# Patient Record
Sex: Male | Born: 2000 | Race: Black or African American | Hispanic: No | Marital: Single | Smoking: Never smoker
Health system: Southern US, Community
[De-identification: ages and names within clinical notes are randomized; demographics above are authoritative.]

## PROBLEM LIST (undated history)

## (undated) DIAGNOSIS — J302 Other seasonal allergic rhinitis: Secondary | ICD-10-CM

---
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
## 2021-08-05 ENCOUNTER — Ambulatory Visit (HOSPITAL_COMMUNITY)
Admission: RE | Admit: 2021-08-05 | Discharge: 2021-08-05 | Disposition: A | Discharge status: Home / Self Care | Payer: No Typology Code available for payment source | Source: Ambulatory Visit | Attending: Pediatrics | Admitting: Pediatrics

## 2021-08-05 ENCOUNTER — Other Ambulatory Visit: Payer: Self-pay

## 2021-08-05 VITALS — BP 129/78 | HR 88 | Temp 98.6°F | Resp 16

## 2021-08-05 DIAGNOSIS — G44319 Acute post-traumatic headache, not intractable: Secondary | ICD-10-CM | POA: Diagnosis not present
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
## 2022-05-19 ENCOUNTER — Emergency Department (HOSPITAL_COMMUNITY)
Admission: EM | Admit: 2022-05-19 | Discharge: 2022-05-19 | Discharge status: Against Medical Advice | Payer: PRIVATE HEALTH INSURANCE | Attending: Emergency Medicine | Admitting: Emergency Medicine

## 2022-05-19 ENCOUNTER — Encounter (HOSPITAL_BASED_OUTPATIENT_CLINIC_OR_DEPARTMENT_OTHER): Payer: Self-pay | Admitting: Emergency Medicine

## 2022-05-19 ENCOUNTER — Emergency Department (HOSPITAL_COMMUNITY): Payer: PRIVATE HEALTH INSURANCE

## 2022-05-19 ENCOUNTER — Encounter (HOSPITAL_COMMUNITY): Payer: Self-pay | Admitting: Emergency Medicine

## 2022-05-19 ENCOUNTER — Emergency Department (HOSPITAL_BASED_OUTPATIENT_CLINIC_OR_DEPARTMENT_OTHER): Payer: PRIVATE HEALTH INSURANCE

## 2022-05-19 ENCOUNTER — Emergency Department (HOSPITAL_BASED_OUTPATIENT_CLINIC_OR_DEPARTMENT_OTHER)
Admission: EM | Admit: 2022-05-19 | Discharge: 2022-05-19 | Disposition: A | Discharge status: Home / Self Care | Payer: PRIVATE HEALTH INSURANCE | Attending: Emergency Medicine | Admitting: Emergency Medicine

## 2022-05-19 ENCOUNTER — Other Ambulatory Visit: Payer: Self-pay

## 2022-05-19 DIAGNOSIS — M25551 Pain in right hip: Secondary | ICD-10-CM | POA: Insufficient documentation

## 2022-05-19 DIAGNOSIS — M5431 Sciatica, right side: Secondary | ICD-10-CM | POA: Diagnosis not present

## 2022-05-19 DIAGNOSIS — X500XXA Overexertion from strenuous movement or load, initial encounter: Secondary | ICD-10-CM | POA: Insufficient documentation

## 2022-05-19 DIAGNOSIS — Z5321 Procedure and treatment not carried out due to patient leaving prior to being seen by health care provider: Secondary | ICD-10-CM | POA: Insufficient documentation

## 2022-05-19 MED ORDER — KETOROLAC TROMETHAMINE 60 MG/2ML IM SOLN
30.0000 mg | Freq: Once | INTRAMUSCULAR | Status: AC
  Administered 2022-05-19: 30 mg via INTRAMUSCULAR
  Filled 2022-05-19: qty 2

## 2022-05-19 MED ORDER — NAPROXEN 375 MG PO TABS: 375.0000 mg | ORAL_TABLET | Freq: Two times a day (BID) | ORAL | 0 refills | Status: DC

## 2022-05-19 MED ORDER — DEXAMETHASONE SODIUM PHOSPHATE 10 MG/ML IJ SOLN
10.0000 mg | Freq: Once | INTRAMUSCULAR | Status: AC
  Administered 2022-05-19: 10 mg via INTRAMUSCULAR
  Filled 2022-05-19: qty 1

## 2022-05-19 MED ORDER — METHYLPREDNISOLONE 4 MG PO TBPK: ORAL_TABLET | ORAL | 0 refills | Status: DC

## 2022-05-19 NOTE — ED Notes (Signed)
This RN received call from [facility] asking that pt be discharged out of system to be seen at their facility, as pt is attempting to register there at this time.

## 2022-05-19 NOTE — ED Triage Notes (Signed)
Pt via POV c/o right hip pain that radiates to right lower leg onset yesterday. Pain worsen today after a full day of work while at rest. Pt works at [employer] with daily heavy lifting. Denies any traumatic injury. No pain on palpation. No tenderness in abdomen. Pt states pain worsens with ambulation.

## 2022-05-19 NOTE — ED Provider Notes (Signed)
[facility] Provider Note  CSN: [id number] Arrival date & time: 05/19/22 [id number]  Chief Complaint(s) Hip Pain  HPI Martin Ortega is a [age] y.o. male     Hip Pain This is a new problem. The current episode started 2 days ago. The problem occurs constantly. The problem has been gradually worsening. Pertinent negatives include no chest pain, no abdominal pain, no headaches and no shortness of breath. Exacerbated by: movement. Relieved by: being still. He has tried acetaminophen and a warm compress for the symptoms. The treatment provided no relief.   Patient denies any falls or trauma.  No bladder/bowel incontinence.  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  Denies any recent fevers or infections.  No IV drug use.  No recent instrumentation.  Past Medical History Past Medical History:  Diagnosis Date   Seasonal allergies    Patient Active Problem List   Diagnosis Date Noted   Congenital pes planus 11/23/2015   Home Medication(s) Prior to Admission medications   Medication Sig Start Date End Date Taking? Authorizing Provider  methylPREDNISolone (MEDROL DOSEPAK) 4 MG TBPK tablet Use as directed on the package 05/19/22  Yes Korayma Hagwood, Amadeo GarnetPedro Eduardo, MD  naproxen (NAPROSYN) 375 MG tablet Take 1 tablet (375 mg total) by mouth 2 (two) times daily. 05/19/22  Yes Torii Royse, Amadeo GarnetPedro Eduardo, MD  cetirizine (ZYRTEC) 10 MG tablet Take 10 mg by mouth daily.    [provider]  ibuprofen (ADVIL,MOTRIN) 600 MG tablet Take 1 tab PO Q6h x 1-2 days then Q6h prn pain. 08/11/16   Lowanda FosterBrewer, Mindy, NP                                                                                                                                    Allergies Patient has no known allergies.  Review of Systems Review of Systems  Respiratory:  Negative for shortness of breath.   Cardiovascular:  Negative for chest pain.  Gastrointestinal:  Negative for abdominal pain.  Neurological:   Negative for headaches.   As noted in HPI  Physical Exam Vital Signs  I have reviewed the triage vital signs BP (!) 145/80 (BP Location: Right Arm)   Pulse 80   Temp 98.5 F (36.9 C) (Oral)   Resp (!) 21   Ht 5\' 8"  (1.727 m)   Wt 93.4 kg   SpO2 100%   BMI 31.32 kg/m   Physical Exam Vitals reviewed.  Constitutional:      General: He is not in acute distress.    Appearance: He is well-developed. He is not diaphoretic.  HENT:     Head: Normocephalic and atraumatic.     Right Ear: External ear normal.     Left Ear: External ear normal.     Nose: Nose normal.     Mouth/Throat:     Mouth: Mucous membranes are moist.  Eyes:     General: No scleral icterus.  Conjunctiva/sclera: Conjunctivae normal.  Neck:     Trachea: Phonation normal.  Cardiovascular:     Rate and Rhythm: Normal rate and regular rhythm.  Pulmonary:     Effort: Pulmonary effort is normal. No respiratory distress.     Breath sounds: No stridor.  Abdominal:     General: There is no distension.  Musculoskeletal:     Cervical back: Normal range of motion.     Right hip: No deformity, tenderness or bony tenderness. Decreased range of motion.     Right upper leg: No tenderness or bony tenderness.     Right lower leg: No tenderness.  Neurological:     Mental Status: He is alert and oriented to person, place, and time.  Psychiatric:        Behavior: Behavior normal.     ED Results and Treatments Labs (all labs ordered are listed, but only abnormal results are displayed) Labs Reviewed - No data to display                                                                                                                       EKG  EKG Interpretation  Date/Time:    Ventricular Rate:    PR Interval:    QRS Duration:   QT Interval:    QTC Calculation:   R Axis:     Text Interpretation:         Radiology DG Hip Unilat W or Wo Pelvis 2-3 Views Right  Result Date: 05/19/2022 CLINICAL DATA:  Right hip  pain. EXAM: DG HIP (WITH OR WITHOUT PELVIS) 2-3V RIGHT COMPARISON:  None Available. FINDINGS: There is no evidence of hip fracture or dislocation. There is no evidence of arthropathy or other focal bone abnormality. IMPRESSION: Negative. Electronically Signed   By: Thornell Sartorius M.D.   On: 05/19/2022 04:07    Pertinent labs & imaging results that were available during my care of the patient were reviewed by me and considered in my medical decision making (see MDM for details).  Medications Ordered in ED Medications  ketorolac (TORADOL) injection 30 mg (30 mg Intramuscular Given 05/19/22 0434)  dexamethasone (DECADRON) injection 10 mg (10 mg Intramuscular Given 05/19/22 0434)                                                                                                                                     Procedures Procedures  (  including critical care time)  Medical Decision Making / ED Course    Complexity of Problem:  Patient's presenting problem/concern, DDX, and MDM listed below: Right hip pain Exam not consistent with contusion, cellulitis.  Doubt DVT.  Doubt arterial occlusion.  No history that would be concerning for septic arthritis.  Not consistent with bursitis.  Favoring sciatica    Complexity of Data:     Imaging Studies ordered listed below with my independent interpretation: X-ray without evidence of fracture or dislocation.  No effusion.    ED Course:    Assessment, Add'l Intervention, and Reassessment: Right hip pain Provided with decadron and toradol Crutches for comfort     Final Clinical Impression(s) / ED Diagnoses Final diagnoses:  Right hip pain  Sciatica of right side   The patient appears reasonably screened and/or stabilized for discharge and I doubt any other medical condition or other [facility] requiring further screening, evaluation, or treatment in the ED at this time prior to discharge. Safe for discharge with strict return  precautions.  Disposition: Discharge  Condition: Good  I have discussed the results, Dx and Tx plan with the patient/family who expressed understanding and agree(s) with the plan. Discharge instructions discussed at length. The patient/family was given strict return precautions who verbalized understanding of the instructions. No further questions at time of discharge.    ED Discharge Orders          Ordered    methylPREDNISolone (MEDROL DOSEPAK) 4 MG TBPK tablet        05/19/22 0602    naproxen (NAPROSYN) 375 MG tablet  2 times daily        05/19/22 0602             Follow Up: Dahlia Byes, MD [address] [place] [address] [phone]  Call  as needed           This chart was dictated using voice recognition software.  Despite best efforts to proofread,  errors can occur which can change the documentation meaning.    Nira Conn, MD 05/19/22 [phone]

## 2022-05-19 NOTE — ED Triage Notes (Signed)
Pt reported to ED  with c/o right hip pain that moves down to upper thigh. Pt states that he lifted weights today but does not feel like he had any injury to area, but endorses difficulty with ambulation since then.

## 2022-07-25 ENCOUNTER — Ambulatory Visit
Admission: EM | Admit: 2022-07-25 | Discharge: 2022-07-25 | Disposition: A | Discharge status: Home / Self Care | Payer: PRIVATE HEALTH INSURANCE | Attending: Emergency Medicine | Admitting: Emergency Medicine

## 2022-07-25 DIAGNOSIS — M545 Low back pain, unspecified: Secondary | ICD-10-CM

## 2022-07-25 DIAGNOSIS — G8929 Other chronic pain: Secondary | ICD-10-CM

## 2022-07-25 MED ORDER — KETOROLAC TROMETHAMINE 30 MG/ML IJ SOLN
30.0000 mg | Freq: Once | INTRAMUSCULAR | Status: AC
  Administered 2022-07-25: 30 mg via INTRAMUSCULAR

## 2022-07-25 NOTE — ED Triage Notes (Signed)
The pt c/o lower back pain. Started: last Saturday  Home interventions: icy hot, heating pads.

## 2022-07-25 NOTE — ED Provider Notes (Signed)
[facility]    CSN: [id number] Arrival date & time: 07/25/22  1701    HISTORY   Chief Complaint  Patient presents with   Back Pain   HPI Martin Ortega is a pleasant, 21 y.o. male who presents to urgent care today. Patient complains of lower back pain for the past 5 days.  Patient states he has tried using IcyHot and heating pads with no relief.  Patient is requesting an injection for pain today.  EMR reviewed, patient has been seen for similar complaints in the past, has a history of pes planus and ED visit for lower back pain with sciatica in June of this year.  Patient has been seen by his primary care provider about his lower back pain who did not recommend any follow-up for this issue.  Patient denies loss of bowel or bladder function.  Patient is well-appearing at this time and does not appear to be in any acute distress.  The history is provided by the patient.   Past Medical History:  Diagnosis Date   Seasonal allergies    Patient Active Problem List   Diagnosis Date Noted   Congenital pes planus 11/23/2015   Past Surgical History:  Procedure Laterality Date   CIRCUMCISION  12/26/2006   PENILE ADHESIONS LYSIS  12/26/2006    Home Medications    Prior to Admission medications   Not on File    Family History History reviewed. No pertinent family history. Social History Social History   Tobacco Use   Smoking status: Never   Smokeless tobacco: Never  Substance Use Topics   Alcohol use: Never   Drug use: Never   Allergies   Patient has no known allergies.  Review of Systems Review of Systems Pertinent findings revealed after performing a 14 point review of systems has been noted in the history of present illness.  Physical Exam Triage Vital Signs ED Triage Vitals  Enc Vitals Group     BP 10/05/21 0827 (!) 147/82     Pulse Rate 10/05/21 0827 72     Resp 10/05/21 0827 18     Temp 10/05/21 0827 98.3 F (36.8 C)     Temp Source 10/05/21 0827  Oral     SpO2 10/05/21 0827 98 %     Weight --      Height --      Head Circumference --      Peak Flow --      Pain Score 10/05/21 0826 5     Pain Loc --      Pain Edu? --      Excl. in GC? --    Updated Vital Signs BP 125/82 (BP Location: Left Arm)   Pulse 77   Temp 99.1 F (37.3 C) (Oral)   Resp 18   SpO2 98%   Physical Exam Vitals and nursing note reviewed.  Constitutional:      General: He is not in acute distress.    Appearance: Normal appearance. He is normal weight. He is not ill-appearing.  HENT:     Head: Normocephalic and atraumatic.  Eyes:     Extraocular Movements: Extraocular movements intact.     Conjunctiva/sclera: Conjunctivae normal.     Pupils: Pupils are equal, round, and reactive to light.  Cardiovascular:     Rate and Rhythm: Normal rate and regular rhythm.  Pulmonary:     Effort: Pulmonary effort is normal.     Breath sounds: Normal breath sounds.  Musculoskeletal:  Cervical back: Normal range of motion and neck supple.     Lumbar back: Spasms and tenderness present. No bony tenderness. Normal range of motion. Positive right straight leg raise test and positive left straight leg raise test.  Skin:    General: Skin is warm and dry.  Neurological:     General: No focal deficit present.     Mental Status: He is alert and oriented to person, place, and time. Mental status is at baseline.  Psychiatric:        Mood and Affect: Mood normal.        Behavior: Behavior normal.        Thought Content: Thought content normal.        Judgment: Judgment normal.     [facility] / Diagnostics / Procedures:     Radiology No results found.  Procedures Procedures (including critical care time) EKG  Pending results:  Labs Reviewed - No data to display  Medications Ordered in UC: Medications  ketorolac (TORADOL) 30 MG/ML injection 30 mg (has no administration in time range)    UC Diagnoses / Final Clinical Impressions(s)   I have reviewed the  triage vital signs and the nursing notes.  Pertinent labs & imaging results that were available during my care of the patient were reviewed by me and considered in my medical decision making (see chart for details).    Final diagnoses:  Chronic right-sided low back pain without sciatica    Patient was provided with an injection during their visit today for acute pain relief.  Patient was advised to:  Instructions to alternate ice and heat to affected areas 20 minutes each Avoid stretching or strengthening exercises until pain is completely resolved Patient education handout provided for self-care at home. Consider physical therapy, chiropractic care, orthopedic follow-up Return precautions advised  ED Prescriptions   None    PDMP not reviewed this encounter.  Discharge Instructions:   Discharge Instructions      The mainstay of therapy for musculoskeletal pain is reduction of inflammation and relaxation of tension which is causing inflammation.  Keep in mind, pain always begets more pain.  To help you stay ahead of your pain and inflammation, I have provided the following regimen for you:   During your visit today, you received an injection of ketorolac, high-dose nonsteroidal anti-inflammatory pain medication that should significantly reduce your pain for the next 6 to 8 hours.    During the day, please set aside time to apply ice to the affected area 4 times daily for 20 minutes each application.  This can be achieved by using a bag of frozen peas or corn, a Ziploc bag filled with ice and water, or Ziploc bag filled with half rubbing alcohol and half Dawn dish detergent, frozen into a slush.  Please be careful not to apply ice directly to your skin, always place a soft cloth between you and the ice pack.   Please consider discussing referral to physical therapy with your primary care provider.  Physical therapist are very good at teasing out the underlying cause of acute lower  back pain and helping with prevention of future recurrences.   Please avoid attempts to stretch or strengthen the affected area until you are feeling completely pain-free.  Attempts to do so will only prolong the healing process.   I also recommend that you remain out of work for the next several days, I provided you with a note to return to work in 3  days.  If you feel that you need this time extended, please follow-up with your primary care provider or return to urgent care for reevaluation so that we can provide you with a note for another 3 days.   Thank you for visiting urgent care today.  We appreciate the opportunity to participate in your care.       Disposition Upon Discharge:  Condition: stable for discharge home Home: take medications as prescribed; routine discharge instructions as discussed; follow up as advised.  Patient presented with an acute illness with associated systemic symptoms and significant discomfort requiring urgent management. In my opinion, this is a condition that a prudent lay person (someone who possesses an average knowledge of health and medicine) may potentially expect to result in complications if not addressed urgently such as respiratory distress, impairment of bodily function or dysfunction of bodily organs.   Routine symptom specific, illness specific and/or disease specific instructions were discussed with the patient and/or caregiver at length.   As such, the patient has been evaluated and assessed, work-up was performed and treatment was provided in alignment with urgent care protocols and evidence based medicine.  Patient/parent/caregiver has been advised that the patient may require follow up for further testing and treatment if the symptoms continue in spite of treatment, as clinically indicated and appropriate.  Patient/parent/caregiver has been advised to report to orthopedic urgent care clinic or return to the [facility] or PCP in 3-5 days if no  better; follow-up with orthopedics, PCP or the Emergency Department if new signs and symptoms develop or if the current signs or symptoms continue to change or worsen for further workup, evaluation and treatment as clinically indicated and appropriate  The patient will follow up with their current PCP if and as advised. If the patient does not currently have a PCP we will have assisted them in obtaining one.   The patient may need specialty follow up if the symptoms continue, in spite of conservative treatment and management, for further workup, evaluation, consultation and treatment as clinically indicated and appropriate.  Patient/parent/caregiver verbalized understanding and agreement of plan as discussed.  All questions were addressed during visit.  Please see discharge instructions below for further details of plan.  This office note has been dictated using Teaching laboratory technician.  Unfortunately, this method of dictation can sometimes lead to typographical or grammatical errors.  I apologize for your inconvenience in advance if this occurs.  Please do not hesitate to reach out to me if clarification is needed.      Theadora Rama Scales, PA-C 07/25/22 1728

## 2022-07-25 NOTE — Discharge Instructions (Addendum)
The mainstay of therapy for musculoskeletal pain is reduction of inflammation and relaxation of tension which is causing inflammation.  Keep in mind, pain always begets more pain.  To help you stay ahead of your pain and inflammation, I have provided the following regimen for you:   During your visit today, you received an injection of ketorolac, high-dose nonsteroidal anti-inflammatory pain medication that should significantly reduce your pain for the next 6 to 8 hours.    During the day, please set aside time to apply ice to the affected area 4 times daily for 20 minutes each application.  This can be achieved by using a bag of frozen peas or corn, a Ziploc bag filled with ice and water, or Ziploc bag filled with half rubbing alcohol and half Dawn dish detergent, frozen into a slush.  Please be careful not to apply ice directly to your skin, always place a soft cloth between you and the ice pack.   Please consider discussing referral to physical therapy with your primary care provider.  Physical therapist are very good at teasing out the underlying cause of acute lower back pain and helping with prevention of future recurrences.   Please avoid attempts to stretch or strengthen the affected area until you are feeling completely pain-free.  Attempts to do so will only prolong the healing process.   I also recommend that you remain out of work for the next several days, I provided you with a note to return to work in 3 days.  If you feel that you need this time extended, please follow-up with your primary care provider or return to urgent care for reevaluation so that we can provide you with a note for another 3 days.   Thank you for visiting urgent care today.  We appreciate the opportunity to participate in your care.

## 2022-08-07 ENCOUNTER — Ambulatory Visit
Admission: RE | Admit: 2022-08-07 | Discharge: 2022-08-07 | Disposition: A | Discharge status: Home / Self Care | Payer: PRIVATE HEALTH INSURANCE | Source: Ambulatory Visit

## 2022-08-07 VITALS — BP 124/76 | HR 73 | Temp 98.5°F | Resp 16

## 2022-08-07 DIAGNOSIS — M545 Low back pain, unspecified: Secondary | ICD-10-CM | POA: Diagnosis not present

## 2022-08-07 DIAGNOSIS — G8929 Other chronic pain: Secondary | ICD-10-CM | POA: Diagnosis not present

## 2022-08-07 DIAGNOSIS — B360 Pityriasis versicolor: Secondary | ICD-10-CM

## 2022-08-07 MED ORDER — KETOCONAZOLE 2 % EX CREA: TOPICAL_CREAM | CUTANEOUS | 1 refills | Status: DC

## 2022-08-07 MED ORDER — KETOROLAC TROMETHAMINE 30 MG/ML IJ SOLN
30.0000 mg | Freq: Once | INTRAMUSCULAR | Status: AC
  Administered 2022-08-07: 30 mg via INTRAMUSCULAR

## 2022-08-07 MED ORDER — METHYLPREDNISOLONE 4 MG PO TBPK: ORAL_TABLET | ORAL | 0 refills | Status: DC

## 2022-08-07 NOTE — Discharge Instructions (Signed)
I have enclosed some information about tinea versicolor that I hope you find helpful.  Please begin using ketoconazole cream once daily for the next 14 days.  Please apply this generously to at least 1 inch surrounding the affected areas each time.  You received another injection of ketorolac during your visit today for your lower back pain.  Have also sent a prescription for methylprednisolone to your pharmacy, please take 1 row of tablets every day with your breakfast meal until the blister pack is complete.  As you and I previously discussed, it is very important that you have a regular primary care provider to help you navigate the process of being treated for lower back pain.  My personal recommendation is physical therapy however this would certainly be up to your primary care provider and you to decide what would be best for you.  Thank you for visiting urgent care today.

## 2022-08-07 NOTE — ED Triage Notes (Addendum)
The pt c/o lower back pain  Started: about 2 weeks ago. Home interventions: heating pad, icy hot gel  He states he has a rash to his upper abd. Started: A few months ago  Home interventions: trintonin cream

## 2022-08-07 NOTE — ED Provider Notes (Signed)
[facility]    CSN: [id number] Arrival date & time: 08/07/22  1004    HISTORY   Chief Complaint  Patient presents with   Back Pain   Rash   HPI Martin Ortega is a pleasant, 21 y.o. male who presents to urgent care today. The pt c/o recurrent lower back pain, symptoms returned about 2 weeks ago.  Of note, patient was seen at this location 2 weeks ago for this reason.  Patient was provided with an injection of ketorolac, given instructions for applying ice to his lower back and advised to follow-up with his PCP to discuss referral to physical therapy.  Patient states that since that visit he has been using a heating pad and IcyHot gel without relief.  Patient complains of a rash on his upper abdomen below his nipple line.  Patient states the rash appeared a few months ago.  Patient states he has been using tretinoin cream on the rash without relief.  I do not see that patient has a history of being prescribed tretinoin cream in his chart.  The history is provided by the patient.   Past Medical History:  Diagnosis Date   Seasonal allergies    Patient Active Problem List   Diagnosis Date Noted   Congenital pes planus 11/23/2015   Past Surgical History:  Procedure Laterality Date   CIRCUMCISION  12/26/2006   PENILE ADHESIONS LYSIS  12/26/2006    Home Medications    Prior to Admission medications   Medication Sig Start Date End Date Taking? Authorizing Provider  ketoconazole (NIZORAL) 2 % cream Apply to affected areas and the 1 inch area surrounding the affected areas once daily for at least 14 days.  Once rash is completely resolved, continue applying to previously affected area for another 7 days to ensure complete resolution. 08/07/22  Yes Theadora Rama Scales, PA-C  methylPREDNISolone (MEDROL DOSEPAK) 4 MG TBPK tablet Take 24 mg on day 1, 20 mg on day 2, 16 mg on day 3, 12 mg on day 4, 8 mg on day 5, 4 mg on day 6.  Take all tablets in each row at once, do not  spread tablets out throughout the day. 08/07/22  Yes Theadora Rama Scales, PA-C  tretinoin (RETIN-A) 0.05 % cream Apply topically at bedtime. 03/25/22   [provider]    Family History History reviewed. No pertinent family history. Social History Social History   Tobacco Use   Smoking status: Never   Smokeless tobacco: Never  Substance Use Topics   Alcohol use: Never   Drug use: Never   Allergies   Patient has no known allergies.  Review of Systems Review of Systems Pertinent findings revealed after performing a 14 point review of systems has been noted in the history of present illness.  Physical Exam Triage Vital Signs ED Triage Vitals  Enc Vitals Group     BP 10/05/21 0827 (!) 147/82     Pulse Rate 10/05/21 0827 72     Resp 10/05/21 0827 18     Temp 10/05/21 0827 98.3 F (36.8 C)     Temp Source 10/05/21 0827 Oral     SpO2 10/05/21 0827 98 %     Weight --      Height --      Head Circumference --      Peak Flow --      Pain Score 10/05/21 0826 5     Pain Loc --      Pain  Edu? --      Excl. in GC? --    Updated Vital Signs BP 124/76 (BP Location: Left Arm)   Pulse 73   Temp 98.5 F (36.9 C) (Oral)   Resp 16   SpO2 98%   Physical Exam Vitals and nursing note reviewed.  Constitutional:      General: He is not in acute distress.    Appearance: Normal appearance. He is normal weight. He is not ill-appearing.  HENT:     Head: Normocephalic and atraumatic.  Eyes:     Extraocular Movements: Extraocular movements intact.     Conjunctiva/sclera: Conjunctivae normal.     Pupils: Pupils are equal, round, and reactive to light.  Cardiovascular:     Rate and Rhythm: Normal rate and regular rhythm.  Pulmonary:     Effort: Pulmonary effort is normal.     Breath sounds: Normal breath sounds.  Musculoskeletal:     Cervical back: Normal range of motion and neck supple.     Lumbar back: Spasms and tenderness present. No bony tenderness. Normal range of  motion. Positive right straight leg raise test and positive left straight leg raise test.  Skin:    General: Skin is warm and dry.     Findings: Rash (Discoloration both inframammary folds concerning for tinea versicolor) present.  Neurological:     General: No focal deficit present.     Mental Status: He is alert and oriented to person, place, and time. Mental status is at baseline.  Psychiatric:        Mood and Affect: Mood normal.        Behavior: Behavior normal.        Thought Content: Thought content normal.        Judgment: Judgment normal.     UC Couse / Diagnostics / Procedures:     Radiology No results found.  Procedures Procedures (including critical care time) EKG  Pending results:  Labs Reviewed - No data to display  Medications Ordered in UC: Medications  ketorolac (TORADOL) 30 MG/ML injection 30 mg (has no administration in time range)    UC Diagnoses / Final Clinical Impressions(s)   I have reviewed the triage vital signs and the nursing notes.  Pertinent labs & imaging results that were available during my care of the patient were reviewed by me and considered in my medical decision making (see chart for details).    Final diagnoses:  Chronic right-sided low back pain without sciatica  Tinea versicolor    Patient was provided with an injection during their visit today for acute pain relief.  Patient was advised to:  Begin Medrol dose pack Begin acetaminophen 1000 mg 3 times daily on a scheduled basis. Apply ice pack to affected area 4 times daily for 20 minutes each time Consider physical therapy, chiropractic care, orthopedic follow-up Apply ketoconazole cream to rash daily until resolved. Return precautions advised  ED Prescriptions     Medication Sig Dispense Auth. Provider   methylPREDNISolone (MEDROL DOSEPAK) 4 MG TBPK tablet Take 24 mg on day 1, 20 mg on day 2, 16 mg on day 3, 12 mg on day 4, 8 mg on day 5, 4 mg on day 6.  Take all  tablets in each row at once, do not spread tablets out throughout the day. 21 tablet Theadora Rama Scales, PA-C   ketoconazole (NIZORAL) 2 % cream Apply to affected areas and the 1 inch area surrounding the affected areas once daily for at  least 14 days.  Once rash is completely resolved, continue applying to previously affected area for another 7 days to ensure complete resolution. 60 g Theadora Rama Scales, PA-C      PDMP not reviewed this encounter.  Discharge Instructions:   Discharge Instructions      I have enclosed some information about tinea versicolor that I hope you find helpful.  Please begin using ketoconazole cream once daily for the next 14 days.  Please apply this generously to at least 1 inch surrounding the affected areas each time.  You received another injection of ketorolac during your visit today for your lower back pain.  Have also sent a prescription for methylprednisolone to your pharmacy, please take 1 row of tablets every day with your breakfast meal until the blister pack is complete.  As you and I previously discussed, it is very important that you have a regular primary care provider to help you navigate the process of being treated for lower back pain.  My personal recommendation is physical therapy however this would certainly be up to your primary care provider and you to decide what would be best for you.  Thank you for visiting urgent care today.    Disposition Upon Discharge:  Condition: stable for discharge home Home: take medications as prescribed; routine discharge instructions as discussed; follow up as advised.  Patient presented with an acute illness with associated systemic symptoms and significant discomfort requiring urgent management. In my opinion, this is a condition that a prudent lay person (someone who possesses an average knowledge of health and medicine) may potentially expect to result in complications if not addressed urgently such  as respiratory distress, impairment of bodily function or dysfunction of bodily organs.   Routine symptom specific, illness specific and/or disease specific instructions were discussed with the patient and/or caregiver at length.   As such, the patient has been evaluated and assessed, work-up was performed and treatment was provided in alignment with urgent care protocols and evidence based medicine.  Patient/parent/caregiver has been advised that the patient may require follow up for further testing and treatment if the symptoms continue in spite of treatment, as clinically indicated and appropriate.  Patient/parent/caregiver has been advised to report to orthopedic urgent care clinic or return to the [facility] or PCP in 3-5 days if no better; follow-up with orthopedics, PCP or the Emergency Department if new signs and symptoms develop or if the current signs or symptoms continue to change or worsen for further workup, evaluation and treatment as clinically indicated and appropriate  The patient will follow up with their current PCP if and as advised. If the patient does not currently have a PCP we will have assisted them in obtaining one.   The patient may need specialty follow up if the symptoms continue, in spite of conservative treatment and management, for further workup, evaluation, consultation and treatment as clinically indicated and appropriate.  Patient/parent/caregiver verbalized understanding and agreement of plan as discussed.  All questions were addressed during visit.  Please see discharge instructions below for further details of plan.  This office note has been dictated using Teaching laboratory technician.  Unfortunately, this method of dictation can sometimes lead to typographical or grammatical errors.  I apologize for your inconvenience in advance if this occurs.  Please do not hesitate to reach out to me if clarification is needed.      Theadora Rama Scales, PA-C 08/07/22  1442

## 2023-03-14 ENCOUNTER — Ambulatory Visit
Admission: RE | Admit: 2023-03-14 | Discharge: 2023-03-14 | Disposition: A | Discharge status: Home / Self Care | Payer: PRIVATE HEALTH INSURANCE | Source: Ambulatory Visit | Attending: Nurse Practitioner | Admitting: Nurse Practitioner

## 2023-03-14 VITALS — BP 130/79 | HR 70 | Temp 97.9°F | Resp 17

## 2023-03-14 DIAGNOSIS — F5104 Psychophysiologic insomnia: Secondary | ICD-10-CM

## 2023-03-14 MED ORDER — HYDROXYZINE HCL 25 MG PO TABS: 50.0000 mg | ORAL_TABLET | Freq: Every evening | ORAL | 0 refills | Status: DC | PRN

## 2023-03-14 NOTE — ED Triage Notes (Signed)
Pt presents with c/o not being able to sleep for a few years. States he has only slept around 3 hours at most the past weeks. Pt states he has tried melatonin, sleep aid, benadryl, nyquil, robitussin. States none have helped.   Pt denies headaches and discomfort.

## 2023-03-14 NOTE — Discharge Instructions (Signed)
Trial of hydroxyzine at night as needed for insomnia Follow-up with your PCP for further treatment options

## 2023-03-14 NOTE — ED Provider Notes (Signed)
[facility]    CSN: [id number] Arrival date & time: 03/14/23  1717      History   Chief Complaint Chief Complaint  Patient presents with   Insomnia    HPI Martin Ortega is a 22 y.o. male presents for evaluation of insomnia.  Patient reports years of insomnia.  States he typically sleeps 3 to 4 hours at night.  He has tried multiple OTC treatments including melatonin, Benadryl, sleep aids, NyQuil without any improvement.  Denies any stressful events, anxiety.  He is supposed to be moving to [place] in November but it keeps getting pushed back.  He has not seen his PCP regarding this.  No other concerns at this time.   Insomnia    Past Medical History:  Diagnosis Date   Seasonal allergies     Patient Active Problem List   Diagnosis Date Noted   Congenital pes planus 11/23/2015    Past Surgical History:  Procedure Laterality Date   CIRCUMCISION  12/26/2006   PENILE ADHESIONS LYSIS  12/26/2006       Home Medications    Prior to Admission medications   Medication Sig Start Date End Date Taking? Authorizing Provider  hydrOXYzine (ATARAX) 25 MG tablet Take 2 tablets (50 mg total) by mouth at bedtime as needed (insomnia). 03/14/23  Yes Radford Pax, NP  ketoconazole (NIZORAL) 2 % cream Apply to affected areas and the 1 inch area surrounding the affected areas once daily for at least 14 days.  Once rash is completely resolved, continue applying to previously affected area for another 7 days to ensure complete resolution. 08/07/22   Theadora Rama Scales, PA-C  methylPREDNISolone (MEDROL DOSEPAK) 4 MG TBPK tablet Take 24 mg on day 1, 20 mg on day 2, 16 mg on day 3, 12 mg on day 4, 8 mg on day 5, 4 mg on day 6.  Take all tablets in each row at once, do not spread tablets out throughout the day. 08/07/22   Theadora Rama Scales, PA-C  tretinoin (RETIN-A) 0.05 % cream Apply topically at bedtime. 03/25/22   [provider]    Family History History reviewed.  No pertinent family history.  Social History Social History   Tobacco Use   Smoking status: Never   Smokeless tobacco: Never  Substance Use Topics   Alcohol use: Never   Drug use: Never     Allergies   Patient has no known allergies.   Review of Systems Review of Systems  Psychiatric/Behavioral:  Positive for sleep disturbance. The patient has insomnia.      Physical Exam Triage Vital Signs ED Triage Vitals  Enc Vitals Group     BP 03/14/23 1735 130/79     Pulse Rate 03/14/23 1735 70     Resp 03/14/23 1735 17     Temp 03/14/23 1735 97.9 F (36.6 C)     Temp Source 03/14/23 1735 Oral     SpO2 03/14/23 1735 97 %     Weight --      Height --      Head Circumference --      Peak Flow --      Pain Score 03/14/23 1734 0     Pain Loc --      Pain Edu? --      Excl. in GC? --    No data found.  Updated Vital Signs BP 130/79 (BP Location: Right Arm)   Pulse 70   Temp 97.9 F (36.6 C) (  Oral)   Resp 17   SpO2 97%   Visual Acuity Right Eye Distance:   Left Eye Distance:   Bilateral Distance:    Right Eye Near:   Left Eye Near:    Bilateral Near:     Physical Exam Vitals and nursing note reviewed.  Constitutional:      Appearance: Normal appearance.  HENT:     Head: Normocephalic and atraumatic.  Eyes:     Pupils: Pupils are equal, round, and reactive to light.  Cardiovascular:     Rate and Rhythm: Normal rate.  Pulmonary:     Effort: Pulmonary effort is normal.  Skin:    General: Skin is warm and dry.  Neurological:     General: No focal deficit present.     Mental Status: He is alert and oriented to person, place, and time.  Psychiatric:        Mood and Affect: Mood normal.        Behavior: Behavior normal.      UC Treatments / Results  Labs (all labs ordered are listed, but only abnormal results are displayed) Labs Reviewed - No data to display  EKG   Radiology No results found.  Procedures Procedures (including critical care  time)  Medications Ordered in UC Medications - No data to display  Initial Impression / Assessment and Plan / UC Course  I have reviewed the triage vital signs and the nursing notes.  Pertinent labs & imaging results that were available during my care of the patient were reviewed by me and considered in my medical decision making (see chart for details).     Discussed with patient potential causes of insomnia Trial of hydroxyzine Discussed sleep hygiene/avoidance of screens/cell phones 2 hours prior to bedtime Follow-up with PCP for further treatment options Final Clinical Impressions(s) / UC Diagnoses   Final diagnoses:  Chronic insomnia     Discharge Instructions      Trial of hydroxyzine at night as needed for insomnia Follow-up with your PCP for further treatment options   ED Prescriptions     Medication Sig Dispense Auth. Provider   hydrOXYzine (ATARAX) 25 MG tablet Take 2 tablets (50 mg total) by mouth at bedtime as needed (insomnia). 28 tablet Radford PaxMayer, Jodi R, NP      PDMP not reviewed this encounter.   Radford PaxMayer, Jodi R, NP 03/14/23 1750

## 2023-12-23 ENCOUNTER — Ambulatory Visit
Admission: RE | Admit: 2023-12-23 | Discharge: 2023-12-23 | Disposition: A | Discharge status: Home / Self Care | Payer: PRIVATE HEALTH INSURANCE | Source: Ambulatory Visit | Attending: Family Medicine | Admitting: Family Medicine

## 2023-12-23 VITALS — BP 133/81 | HR 88 | Temp 99.1°F | Resp 16

## 2023-12-23 DIAGNOSIS — L209 Atopic dermatitis, unspecified: Secondary | ICD-10-CM | POA: Diagnosis not present

## 2023-12-23 DIAGNOSIS — M545 Low back pain, unspecified: Secondary | ICD-10-CM

## 2023-12-23 MED ORDER — NAPROXEN 500 MG PO TABS: 500.0000 mg | ORAL_TABLET | Freq: Two times a day (BID) | ORAL | 0 refills | Status: DC

## 2023-12-23 MED ORDER — CYCLOBENZAPRINE HCL 5 MG PO TABS: 5.0000 mg | ORAL_TABLET | Freq: Every evening | ORAL | 0 refills | Status: DC | PRN

## 2023-12-23 MED ORDER — TRIAMCINOLONE ACETONIDE 0.1 % EX CREA: 1.0000 | TOPICAL_CREAM | Freq: Two times a day (BID) | CUTANEOUS | 0 refills | Status: AC

## 2023-12-23 NOTE — ED Triage Notes (Signed)
 Pt reports right sided low back pain x 1 week. Per mother, pt was hospitalized due to sciatic pain.   Pt requested treatment fro eczema.

## 2023-12-23 NOTE — ED Provider Notes (Signed)
 [facility]  Note:  This document was prepared using Conservation officer, historic buildings and may include unintentional dictation errors.  MRN: [id number] DOB: 2001-12-03  Subjective:   Martin Ortega is a 23 y.o. male presenting for 1 week history of persistent right-sided low back pain.  Patient has previously been diagnosed with sciatica. No fall, trauma, numbness or tingling, saddle paresthesia, changes to bowel or urinary habits, radicular symptoms.  Was seen and had a hip x-ray done before that was normal.  No MRI or evaluation with a back specialist.  Patient has also had difficulty with eczema about his neck area.  Has previously used topical treatments for that.  No current facility-administered medications for this encounter.  Current Outpatient Medications:    hydrOXYzine  (ATARAX ) 25 MG tablet, Take 2 tablets (50 mg total) by mouth at bedtime as needed (insomnia)., Disp: 28 tablet, Rfl: 0   ketoconazole  (NIZORAL ) 2 % cream, Apply to affected areas and the 1 inch area surrounding the affected areas once daily for at least 14 days.  Once rash is completely resolved, continue applying to previously affected area for another 7 days to ensure complete resolution., Disp: 60 g, Rfl: 1   methylPREDNISolone  (MEDROL  DOSEPAK) 4 MG TBPK tablet, Take 24 mg on day 1, 20 mg on day 2, 16 mg on day 3, 12 mg on day 4, 8 mg on day 5, 4 mg on day 6.  Take all tablets in each row at once, do not spread tablets out throughout the day., Disp: 21 tablet, Rfl: 0   tretinoin (RETIN-A) 0.05 % cream, Apply topically at bedtime., Disp: , Rfl:    No Known Allergies  Past Medical History:  Diagnosis Date   Seasonal allergies      Past Surgical History:  Procedure Laterality Date   CIRCUMCISION  12/26/2006   PENILE ADHESIONS LYSIS  12/26/2006    Family History  Problem Relation Age of Onset   Healthy Mother     Social History   Tobacco Use   Smoking status: Never   Smokeless  tobacco: Never  Vaping Use   Vaping status: Never Used  Substance Use Topics   Alcohol use: Never   Drug use: Never    ROS   Objective:   Vitals: BP 133/81 (BP Location: Left Arm)   Pulse 88   Temp 99.1 F (37.3 C) (Oral)   Resp 16   SpO2 96%   Physical Exam Constitutional:      General: He is not in acute distress.    Appearance: Normal appearance. He is well-developed and normal weight. He is not ill-appearing, toxic-appearing or diaphoretic.  HENT:     Head: Normocephalic and atraumatic.     Right Ear: External ear normal.     Left Ear: External ear normal.     Nose: Nose normal.     Mouth/Throat:     Pharynx: Oropharynx is clear.  Eyes:     General: No scleral icterus.       Right eye: No discharge.        Left eye: No discharge.     Extraocular Movements: Extraocular movements intact.  Cardiovascular:     Rate and Rhythm: Normal rate.  Pulmonary:     Effort: Pulmonary effort is normal.  Musculoskeletal:     Cervical back: Normal range of motion.     Lumbar back: Tenderness (mild) present. No swelling, edema, deformity, signs of trauma, lacerations, spasms or bony tenderness. Normal range  of motion. Negative right straight leg raise test and negative left straight leg raise test. No scoliosis.       Back:  Neurological:     Mental Status: He is alert and oriented to person, place, and time.  Psychiatric:        Mood and Affect: Mood normal.        Behavior: Behavior normal.        Thought Content: Thought content normal.        Judgment: Judgment normal.     Assessment and Plan :   PDMP not reviewed this encounter.  1. Acute right-sided low back pain without sciatica   2. Atopic dermatitis, unspecified type    Will manage conservatively for back strain with NSAID and muscle relaxant, rest and modification of physical activity.  Anticipatory guidance provided.  Reviewed back care.  Recommended triamcinolone  cream for the atopic dermatitis.  Discussed  appropriate use of topical steroids.  Counseled patient on potential for adverse effects with medications prescribed/recommended today, ER and return-to-clinic precautions discussed, patient verbalized understanding.    Christopher Savannah, [place] 12/26/23 [phone]

## 2024-03-26 ENCOUNTER — Ambulatory Visit
Admission: EM | Admit: 2024-03-26 | Discharge: 2024-03-26 | Disposition: A | Discharge status: Home / Self Care | Payer: PRIVATE HEALTH INSURANCE | Attending: Family Medicine | Admitting: Family Medicine

## 2024-03-26 DIAGNOSIS — B360 Pityriasis versicolor: Secondary | ICD-10-CM | POA: Diagnosis not present

## 2024-03-26 MED ORDER — KETOCONAZOLE 2 % EX CREA: 1.0000 | TOPICAL_CREAM | Freq: Every day | CUTANEOUS | 0 refills | Status: DC

## 2024-03-26 NOTE — Discharge Instructions (Addendum)
 Start ketoconazole  topically daily to the rash.  Follow-up with your PCP if your symptoms do not improve.  Please go to the ER for any worsening symptoms.  Hope you feel better soon!

## 2024-03-26 NOTE — ED Provider Notes (Signed)
 [facility]    CSN: [id number] Arrival date & time: 03/26/24  1701      History   Chief Complaint Chief Complaint  Patient presents with   Rash    HPI Martin Ortega is a 23 y.o. male presents for rash.  Patient reports a rash to his lower chest/upper abdomen for several months.  Denies any swelling, drainage, pain, itching, fevers or chills.  Does have a history of eczema but states this is not his typical eczema presentation.  Does endorse that he sweats a lot in that area.  No new contacts including soaps, medications, detergents, etc.  He has not used any OTC treatments for this.  No other concerns at this time.   Rash   Past Medical History:  Diagnosis Date   Seasonal allergies     Patient Active Problem List   Diagnosis Date Noted   Congenital pes planus 11/23/2015    Past Surgical History:  Procedure Laterality Date   CIRCUMCISION  12/26/2006   PENILE ADHESIONS LYSIS  12/26/2006       Home Medications    Prior to Admission medications   Medication Sig Start Date End Date Taking? Authorizing Provider  ketoconazole  (NIZORAL ) 2 % cream Apply 1 Application topically daily. 03/26/24  Yes Ivalee Strauser, Jodi R, NP  cyclobenzaprine  (FLEXERIL ) 5 MG tablet Take 1 tablet (5 mg total) by mouth at bedtime as needed. 12/23/23   Adolph Hoop, PA-C  hydrOXYzine  (ATARAX ) 25 MG tablet Take 2 tablets (50 mg total) by mouth at bedtime as needed (insomnia). 03/14/23   Zyrion Coey, Jodi R, NP  methylPREDNISolone  (MEDROL  DOSEPAK) 4 MG TBPK tablet Take 24 mg on day 1, 20 mg on day 2, 16 mg on day 3, 12 mg on day 4, 8 mg on day 5, 4 mg on day 6.  Take all tablets in each row at once, do not spread tablets out throughout the day. Patient not taking: Reported on 03/26/2024 08/07/22   Eloise Hake Scales, PA-C  naproxen  (NAPROSYN ) 500 MG tablet Take 1 tablet (500 mg total) by mouth 2 (two) times daily with a meal. 12/23/23   Adolph Hoop, PA-C  tretinoin (RETIN-A) 0.05 % cream Apply topically at  bedtime. 03/25/22   [provider]  triamcinolone  cream (KENALOG ) 0.1 % Apply 1 Application topically 2 (two) times daily. 12/23/23   Adolph Hoop, PA-C    Family History Family History  Problem Relation Age of Onset   Healthy Mother     Social History Social History   Tobacco Use   Smoking status: Never   Smokeless tobacco: Never  Vaping Use   Vaping status: Never Used  Substance Use Topics   Alcohol use: Never   Drug use: Never     Allergies   Patient has no known allergies.   Review of Systems Review of Systems  Skin:  Positive for rash.     Physical Exam Triage Vital Signs ED Triage Vitals  Encounter Vitals Group     BP 03/26/24 1713 (!) 147/92     Systolic BP Percentile --      Diastolic BP Percentile --      Pulse Rate 03/26/24 1713 90     Resp 03/26/24 1713 16     Temp 03/26/24 1713 98.7 F (37.1 C)     Temp src --      SpO2 03/26/24 1713 98 %     Weight --      Height --  Head Circumference --      Peak Flow --      Pain Score 03/26/24 1712 0     Pain Loc --      Pain Education --      Exclude from Growth Chart --    No data found.  Updated Vital Signs BP (!) 147/92   Pulse 90   Temp 98.7 F (37.1 C)   Resp 16   SpO2 98%   Visual Acuity Right Eye Distance:   Left Eye Distance:   Bilateral Distance:    Right Eye Near:   Left Eye Near:    Bilateral Near:     Physical Exam Vitals and nursing note reviewed.  Constitutional:      General: He is not in acute distress.    Appearance: Normal appearance. He is not ill-appearing.  HENT:     Head: Normocephalic and atraumatic.  Eyes:     Pupils: Pupils are equal, round, and reactive to light.  Cardiovascular:     Rate and Rhythm: Normal rate.  Pulmonary:     Effort: Pulmonary effort is normal.  Skin:    General: Skin is warm and dry.          Comments: Multiple hypopigmented patches on chest below nipple line.  Neurological:     General: No focal deficit present.      Mental Status: He is alert and oriented to person, place, and time.  Psychiatric:        Mood and Affect: Mood normal.        Behavior: Behavior normal.      UC Treatments / Results  Labs (all labs ordered are listed, but only abnormal results are displayed) Labs Reviewed - No data to display  EKG   Radiology No results found.  Procedures Procedures (including critical care time)  Medications Ordered in UC Medications - No data to display  Initial Impression / Assessment and Plan / UC Course  I have reviewed the triage vital signs and the nursing notes.  Pertinent labs & imaging results that were available during my care of the patient were reviewed by me and considered in my medical decision making (see chart for details).     Reviewed exam and symptoms with patient.  No red flags.  Discussed exam consistent with tinea versicolor.  Will do trial of ketoconazole  topically daily.  Advised PCP or dermatology follow-up if symptoms do not improve.  ER precautions reviewed and patient verbalized understanding. Final Clinical Impressions(s) / UC Diagnoses   Final diagnoses:  Tinea versicolor     Discharge Instructions      Start ketoconazole  topically daily to the rash.  Follow-up with your PCP if your symptoms do not improve.  Please go to the ER for any worsening symptoms.  Hope you feel better soon!    ED Prescriptions     Medication Sig Dispense Auth. Provider   ketoconazole  (NIZORAL ) 2 % cream Apply 1 Application topically daily. 60 g Rylei Masella, Jodi R, NP      PDMP not reviewed this encounter.   Alleen Arbour, NP 03/26/24 1740

## 2024-03-26 NOTE — ED Triage Notes (Signed)
 Pt presents to [facility] with co of rash to chest for a few months. Pt denies any itchiness or pain.

## 2024-06-15 ENCOUNTER — Ambulatory Visit
Admission: RE | Admit: 2024-06-15 | Discharge: 2024-06-15 | Disposition: A | Discharge status: Home / Self Care | Payer: PRIVATE HEALTH INSURANCE | Source: Ambulatory Visit | Attending: Family Medicine | Admitting: Family Medicine

## 2024-06-15 VITALS — BP 145/77 | HR 93 | Temp 98.3°F | Resp 16

## 2024-06-15 DIAGNOSIS — R438 Other disturbances of smell and taste: Secondary | ICD-10-CM | POA: Diagnosis not present

## 2024-06-15 DIAGNOSIS — T887XXA Unspecified adverse effect of drug or medicament, initial encounter: Secondary | ICD-10-CM

## 2024-06-15 NOTE — ED Triage Notes (Signed)
 Pt present with c/o a bad taste in mouth x three mouth. Pt denies sore throat.

## 2024-06-15 NOTE — Discharge Instructions (Addendum)
 Please follow up with the prescriber of your weight loss medication to discuss this side effect as that medication can cause a bitter taste in your mouth.

## 2024-06-15 NOTE — ED Provider Notes (Signed)
 [facility]    CSN: [id number] Arrival date & time: 06/15/24  1520      History   Chief Complaint Chief Complaint  Patient presents with   Sore Throat    HPI Martin Ortega is a 23 y.o. male presents for bitter taste in mouth. Patient reports he started diethylpropion for weight loss 3 months ago.  States over the past 3 days he has had a bitter taste in his mouth.  He denies any dry mouth, mouth pain, coating on his mouth or swelling, or bad breath.  No sore throat, dental pain, or pain with eating.  He reports a history of tonsil stones that have caused a similar taste but when he checked he didn't see any.  He is able to eat and drink without any difficulty.  No other concerns at this time.  Sore Throat    Past Medical History:  Diagnosis Date   Seasonal allergies     Patient Active Problem List   Diagnosis Date Noted   Congenital pes planus 11/23/2015    Past Surgical History:  Procedure Laterality Date   CIRCUMCISION  12/26/2006   PENILE ADHESIONS LYSIS  12/26/2006       Home Medications    Prior to Admission medications   Medication Sig Start Date End Date Taking? Authorizing Provider  cyclobenzaprine  (FLEXERIL ) 5 MG tablet Take 1 tablet (5 mg total) by mouth at bedtime as needed. 12/23/23   Christopher Savannah, PA-C  hydrOXYzine  (ATARAX ) 25 MG tablet Take 2 tablets (50 mg total) by mouth at bedtime as needed (insomnia). 03/14/23   Farhad Burleson, Jodi R, NP  ketoconazole  (NIZORAL ) 2 % cream Apply 1 Application topically daily. 03/26/24   Salayah Meares, Jodi R, NP  methylPREDNISolone  (MEDROL  DOSEPAK) 4 MG TBPK tablet Take 24 mg on day 1, 20 mg on day 2, 16 mg on day 3, 12 mg on day 4, 8 mg on day 5, 4 mg on day 6.  Take all tablets in each row at once, do not spread tablets out throughout the day. Patient not taking: Reported on 03/26/2024 08/07/22   Joesph Shaver Scales, PA-C  naproxen  (NAPROSYN ) 500 MG tablet Take 1 tablet (500 mg total) by mouth 2 (two) times daily with a meal.  12/23/23   Christopher Savannah, PA-C  tretinoin (RETIN-A) 0.05 % cream Apply topically at bedtime. 03/25/22   [provider]  triamcinolone  cream (KENALOG ) 0.1 % Apply 1 Application topically 2 (two) times daily. 12/23/23   Christopher Savannah, PA-C    Family History Family History  Problem Relation Age of Onset   Healthy Mother     Social History Social History   Tobacco Use   Smoking status: Never   Smokeless tobacco: Never  Vaping Use   Vaping status: Never Used  Substance Use Topics   Alcohol use: Never   Drug use: Never     Allergies   Patient has no known allergies.   Review of Systems Review of Systems  HENT:         Bitter taste in mouth     Physical Exam Triage Vital Signs ED Triage Vitals [06/15/24 1525]  Encounter Vitals Group     BP (!) 145/77     Girls Systolic BP Percentile      Girls Diastolic BP Percentile      Boys Systolic BP Percentile      Boys Diastolic BP Percentile      Pulse Rate 93     Resp 16  Temp 98.3 F (36.8 C)     Temp Source Oral     SpO2 98 %     Weight      Height      Head Circumference      Peak Flow      Pain Score 0     Pain Loc      Pain Education      Exclude from Growth Chart    No data found.  Updated Vital Signs BP (!) 145/77 (BP Location: Right Arm)   Pulse 93   Temp 98.3 F (36.8 C) (Oral)   Resp 16   SpO2 98%   Visual Acuity Right Eye Distance:   Left Eye Distance:   Bilateral Distance:    Right Eye Near:   Left Eye Near:    Bilateral Near:     Physical Exam Vitals and nursing note reviewed.  Constitutional:      Appearance: Normal appearance.  HENT:     Head: Normocephalic and atraumatic.     Mouth/Throat:     Mouth: Mucous membranes are moist.     Pharynx: Oropharynx is clear. Uvula midline. No pharyngeal swelling, oropharyngeal exudate, posterior oropharyngeal erythema, uvula swelling or postnasal drip.     Tonsils: No tonsillar exudate or tonsillar abscesses.  Eyes:     Pupils: Pupils  are equal, round, and reactive to light.  Cardiovascular:     Rate and Rhythm: Normal rate.  Pulmonary:     Effort: Pulmonary effort is normal.  Skin:    General: Skin is warm and dry.  Neurological:     General: No focal deficit present.     Mental Status: He is alert and oriented to person, place, and time.  Psychiatric:        Mood and Affect: Mood normal.        Behavior: Behavior normal.      UC Treatments / Results  Labs (all labs ordered are listed, but only abnormal results are displayed) Labs Reviewed - No data to display  EKG   Radiology No results found.  Procedures Procedures (including critical care time)  Medications Ordered in UC Medications - No data to display  Initial Impression / Assessment and Plan / UC Course  I have reviewed the triage vital signs and the nursing notes.  Pertinent labs & imaging results that were available during my care of the patient were reviewed by me and considered in my medical decision making (see chart for details).     Reviewed exam and symptoms with patient.  Exam is unremarkable.  Discussed that a side effect of his weight loss medication is a bitter taste in his mouth.  Advised that he contact the prescriber of this medication to discuss this otherwise follow-up with his PCP as needed. ER precautions reviewed.  Final Clinical Impressions(s) / UC Diagnoses   Final diagnoses:  Side effect of medication  Bitter taste     Discharge Instructions      Please follow up with the prescriber of your weight loss medication to discuss this side effect as that medication can cause a bitter taste in your mouth.      ED Prescriptions   None    PDMP not reviewed this encounter.   Loreda Myla SAUNDERS, NP 06/15/24 1550

## 2024-09-14 ENCOUNTER — Telehealth: Payer: Self-pay

## 2024-09-14 NOTE — Telephone Encounter (Signed)
 Pt needs to be seeing for ketoconazole  refil

## 2024-09-15 ENCOUNTER — Ambulatory Visit
Admission: RE | Admit: 2024-09-15 | Discharge: 2024-09-15 | Disposition: A | Discharge status: Home / Self Care | Payer: Self-pay | Source: Ambulatory Visit | Attending: Family Medicine | Admitting: Family Medicine

## 2024-09-15 VITALS — BP 136/84 | HR 85 | Temp 99.3°F | Resp 19

## 2024-09-15 DIAGNOSIS — B360 Pityriasis versicolor: Secondary | ICD-10-CM | POA: Diagnosis not present

## 2024-09-15 MED ORDER — KETOCONAZOLE 2 % EX CREA: 1.0000 | TOPICAL_CREAM | Freq: Every day | CUTANEOUS | 1 refills | Status: AC

## 2024-09-15 NOTE — Discharge Instructions (Addendum)
 Start ketoconazole  antifungal cream daily to the affected area.  Please follow-up with your PCP or dermatology if symptoms do not improve.  Please go to the ER for any worsening symptoms.  Hope you feel better soon!

## 2024-09-15 NOTE — ED Provider Notes (Signed)
 [facility]    CSN: [id number] Arrival date & time: 09/15/24  9048      History   Chief Complaint Chief Complaint  Patient presents with   Medication Refill    Rash on check and neck area - Entered by patient    HPI Martin Ortega is a 23 y.o. male presents for rash.  Patient was seen in urgent care on March 26, 2024.  He was treated for tinea versicolor with ketoconazole  topically.  He states it improved greatly but he was not consistent and has since run out and would like additional treatment.  He still reports hypopigmented rash on his abdomen.  No fevers, chills, drainage.  No other concerns at this time.   Medication Refill   Past Medical History:  Diagnosis Date   Seasonal allergies     Patient Active Problem List   Diagnosis Date Noted   Congenital pes planus 11/23/2015    Past Surgical History:  Procedure Laterality Date   CIRCUMCISION  12/26/2006   PENILE ADHESIONS LYSIS  12/26/2006       Home Medications    Prior to Admission medications   Medication Sig Start Date End Date Taking? Authorizing Provider  ketoconazole  (NIZORAL ) 2 % cream Apply 1 Application topically daily. 09/15/24  Yes Kennard Fildes, Jodi R, NP  cyclobenzaprine  (FLEXERIL ) 5 MG tablet Take 1 tablet (5 mg total) by mouth at bedtime as needed. 12/23/23   Christopher Savannah, PA-C  hydrOXYzine  (ATARAX ) 25 MG tablet Take 2 tablets (50 mg total) by mouth at bedtime as needed (insomnia). 03/14/23   Moustapha Tooker, Jodi R, NP  naproxen  (NAPROSYN ) 500 MG tablet Take 1 tablet (500 mg total) by mouth 2 (two) times daily with a meal. 12/23/23   Christopher Savannah, PA-C  tretinoin (RETIN-A) 0.05 % cream Apply topically at bedtime. 03/25/22   [provider]  triamcinolone  cream (KENALOG ) 0.1 % Apply 1 Application topically 2 (two) times daily. 12/23/23   Christopher Savannah, PA-C    Family History Family History  Problem Relation Age of Onset   Healthy Mother     Social History Social History   Tobacco Use   Smoking  status: Never   Smokeless tobacco: Never  Vaping Use   Vaping status: Never Used  Substance Use Topics   Alcohol use: Never   Drug use: Never     Allergies   Patient has no known allergies.   Review of Systems Review of Systems  Skin:  Positive for rash.     Physical Exam Triage Vital Signs ED Triage Vitals [09/15/24 1000]  Encounter Vitals Group     BP 136/84     Girls Systolic BP Percentile      Girls Diastolic BP Percentile      Boys Systolic BP Percentile      Boys Diastolic BP Percentile      Pulse Rate 85     Resp 19     Temp 99.3 F (37.4 C)     Temp Source Oral     SpO2 96 %     Weight      Height      Head Circumference      Peak Flow      Pain Score 0     Pain Loc      Pain Education      Exclude from Growth Chart    No data found.  Updated Vital Signs BP 136/84 (BP Location: Left Arm)   Pulse 85  Temp 99.3 F (37.4 C) (Oral)   Resp 19   SpO2 96%   Visual Acuity Right Eye Distance:   Left Eye Distance:   Bilateral Distance:    Right Eye Near:   Left Eye Near:    Bilateral Near:     Physical Exam Vitals and nursing note reviewed.  Constitutional:      General: He is not in acute distress.    Appearance: Normal appearance. He is not ill-appearing.  HENT:     Head: Normocephalic and atraumatic.  Eyes:     Pupils: Pupils are equal, round, and reactive to light.  Cardiovascular:     Rate and Rhythm: Normal rate.  Pulmonary:     Effort: Pulmonary effort is normal.  Skin:    General: Skin is warm and dry.         Comments: Scattered hypopigmented patches on abdomen  Neurological:     General: No focal deficit present.     Mental Status: He is alert and oriented to person, place, and time.  Psychiatric:        Mood and Affect: Mood normal.        Behavior: Behavior normal.      UC Treatments / Results  Labs (all labs ordered are listed, but only abnormal results are displayed) Labs Reviewed - No data to  display  EKG   Radiology No results found.  Procedures Procedures (including critical care time)  Medications Ordered in UC Medications - No data to display  Initial Impression / Assessment and Plan / UC Course  I have reviewed the triage vital signs and the nursing notes.  Pertinent labs & imaging results that were available during my care of the patient were reviewed by me and considered in my medical decision making (see chart for details).     Reviewed exam and symptoms with patient.  Will restart ketoconazole  topically.  Advised follow-up with PCP or dermatology if symptoms do not improve.  ER precautions reviewed Final Clinical Impressions(s) / UC Diagnoses   Final diagnoses:  Tinea versicolor     Discharge Instructions      Start ketoconazole  antifungal cream daily to the affected area.  Please follow-up with your PCP or dermatology if symptoms do not improve.  Please go to the ER for any worsening symptoms.  Hope you feel better soon!    ED Prescriptions     Medication Sig Dispense Auth. Provider   ketoconazole  (NIZORAL ) 2 % cream Apply 1 Application topically daily. 60 g Jayke Caul, Jodi R, NP      PDMP not reviewed this encounter.   Loreda Myla SAUNDERS, NP 09/15/24 1015

## 2024-09-15 NOTE — ED Triage Notes (Signed)
 Pt present for medication refill. Pt states he ran out of ketoconazole . States the rash is back.

## 2024-11-17 VITALS — BP 132/86 | HR 91 | Temp 98.7°F | Resp 16

## 2024-11-17 DIAGNOSIS — M25532 Pain in left wrist: Secondary | ICD-10-CM

## 2024-11-17 DIAGNOSIS — G8929 Other chronic pain: Secondary | ICD-10-CM

## 2024-11-17 DIAGNOSIS — M778 Other enthesopathies, not elsewhere classified: Secondary | ICD-10-CM | POA: Diagnosis not present

## 2024-11-17 MED ORDER — PREDNISONE 20 MG PO TABS: 40.0000 mg | ORAL_TABLET | Freq: Every day | ORAL | 0 refills | Status: AC

## 2024-11-17 NOTE — Discharge Instructions (Addendum)
 You were seen today for wrist pain. Your symptoms are most consistent with tendonitis. Tendonitis is inflammation of the tendons, which are the tough cords that connect muscles to bones. When tendons become irritated, they can cause pain, stiffness, and discomfort with movement or activity. You have been placed in a wrist splint to provide support and reduce strain. Take the prednisone as prescribed. You may use Tylenol if you need extra pain relief. Apply ice to the wrist for 20 minutes at a time several times a day, using a towel or cloth between the ice and your skin to avoid irritation. Avoid heavy lifting, pushing, or pulling with the wrist until your symptoms improve. Most cases of tendonitis improve within a few weeks with rest, medication, and supportive care. Please follow up with an orthopedic specialist if your pain does not improve, interferes with daily activities, or worsens. Go to the emergency department right away if you develop sudden severe pain, inability to move your wrist or fingers, new numbness, tingling, weakness, or changes in color or temperature of your hand.

## 2024-11-17 NOTE — ED Provider Notes (Signed)
 [facility]    CSN: [id number] Arrival date & time: 11/17/24  9056      History   Chief Complaint Chief Complaint  Patient presents with   Wrist Pain    Left wrist discomfort when lifting or constant movement - Entered by patient    HPI Martin Ortega is a 23 y.o. male.   Discussed the use of AI scribe software for clinical note transcription with the patient, who gave verbal consent to proceed.   The patient presents with approximately one year of intermittent left wrist discomfort. He describes the sensation as discomfort rather than pain, occurring with certain movements and consistently triggered when using the left hand in specific positions. He denies swelling, numbness, tingling, or weakness in the hand or fingers. He is right-hand dominant and denies performing repetitive motions with the left wrist on a daily basis. He has not previously sought evaluation for this issue and has not tried any treatments or interventions for symptom relief.  The following sections of the patient's history were reviewed and updated as appropriate: allergies, current medications, past family history, past medical history, past social history, past surgical history, and problem list.      Past Medical History:  Diagnosis Date   Seasonal allergies     Patient Active Problem List   Diagnosis Date Noted   Congenital pes planus 11/23/2015    Past Surgical History:  Procedure Laterality Date   CIRCUMCISION  12/26/2006   PENILE ADHESIONS LYSIS  12/26/2006       Home Medications    Prior to Admission medications   Medication Sig Start Date End Date Taking? Authorizing Provider  predniSONE (DELTASONE) 20 MG tablet Take 2 tablets (40 mg total) by mouth daily for 5 days. 11/17/24 11/22/24 Yes Iola Lukes, FNP  ketoconazole  (NIZORAL ) 2 % cream Apply 1 Application topically daily. 09/15/24   Mayer, Jodi R, NP  phentermine 30 MG capsule Take 30 mg by mouth every morning.     [provider]  tretinoin (RETIN-A) 0.05 % cream Apply topically at bedtime. 03/25/22   [provider]  triamcinolone  cream (KENALOG ) 0.1 % Apply 1 Application topically 2 (two) times daily. 12/23/23   Christopher Savannah, PA-C    Family History Family History  Problem Relation Age of Onset   Healthy Mother     Social History Social History   Tobacco Use   Smoking status: Never   Smokeless tobacco: Never  Vaping Use   Vaping status: Never Used  Substance Use Topics   Alcohol use: Not Currently   Drug use: Never     Allergies   Patient has no known allergies.   Review of Systems Review of Systems  Musculoskeletal:  Positive for arthralgias. Negative for joint swelling.  Neurological:  Negative for numbness.  All other systems reviewed and are negative.    Physical Exam Triage Vital Signs ED Triage Vitals  Encounter Vitals Group     BP 11/17/24 1000 132/86     Girls Systolic BP Percentile --      Girls Diastolic BP Percentile --      Boys Systolic BP Percentile --      Boys Diastolic BP Percentile --      Pulse Rate 11/17/24 1000 91     Resp 11/17/24 1000 16     Temp 11/17/24 1000 98.7 F (37.1 C)     Temp Source 11/17/24 1000 Oral     SpO2 11/17/24 1000 99 %  Weight --      Height --      Head Circumference --      Peak Flow --      Pain Score 11/17/24 0957 4     Pain Loc --      Pain Education --      Exclude from Growth Chart --    No data found.  Updated Vital Signs BP 132/86 (BP Location: Right Arm)   Pulse 91   Temp 98.7 F (37.1 C) (Oral)   Resp 16   SpO2 99%   Visual Acuity Right Eye Distance:   Left Eye Distance:   Bilateral Distance:    Right Eye Near:   Left Eye Near:    Bilateral Near:     Physical Exam Vitals reviewed.  Constitutional:      General: He is awake. He is not in acute distress.    Appearance: Normal appearance. He is well-developed. He is not ill-appearing, toxic-appearing or diaphoretic.  HENT:      Head: Normocephalic.     Right Ear: Hearing normal.     Left Ear: Hearing normal.     Nose: Nose normal.     Mouth/Throat:     Mouth: Mucous membranes are moist.  Eyes:     General: Vision grossly intact.     Conjunctiva/sclera: Conjunctivae normal.  Cardiovascular:     Rate and Rhythm: Normal rate and regular rhythm.     Heart sounds: Normal heart sounds.  Pulmonary:     Effort: Pulmonary effort is normal.     Breath sounds: Normal breath sounds and air entry.  Musculoskeletal:        General: Normal range of motion.     Left wrist: Tenderness present. No swelling, deformity or effusion. Normal range of motion.     Left hand: Normal.     Cervical back: Normal range of motion and neck supple.     Comments: Full active and passive range of motion of the left wrist with subjective tenderness along the medial aspect. No swelling, deformity, or visible abnormalities are noted. Strength and sensation are intact, with strong and equal grip strength bilaterally. The extremity is neurovascularly intact.  Skin:    General: Skin is warm and dry.  Neurological:     General: No focal deficit present.     Mental Status: He is alert and oriented to person, place, and time.     Sensory: Sensation is intact. No sensory deficit.     Motor: Motor function is intact. No weakness.  Psychiatric:        Speech: Speech normal.        Behavior: Behavior is cooperative.      UC Treatments / Results  Labs (all labs ordered are listed, but only abnormal results are displayed) Labs Reviewed - No data to display  EKG   Radiology No results found.  Procedures Procedures (including critical care time)  Medications Ordered in UC Medications - No data to display  Initial Impression / Assessment and Plan / UC Course  I have reviewed the triage vital signs and the nursing notes.  Pertinent labs & imaging results that were available during my care of the patient were reviewed by me and  considered in my medical decision making (see chart for details).     The patient was evaluated for left wrist pain without history of injury. Physical exam unremarkable. Imaging deferred. Symptoms are most consistent with tendonitis. A wrist splint was applied  for support, and prescription for prednisone provided. Patient may Tylenol for pain relief if needed. Supportive measures including frequent icing for 20 minutes at a time with a barrier to protect the skin and avoidance of heavy lifting with the affected hand were reviewed. The patient was instructed to follow up with orthopedics if symptoms do not improve or worsen.  Today's evaluation has revealed no signs of a dangerous process. Discussed diagnosis with patient and/or guardian. Patient and/or guardian aware of their diagnosis, possible red flag symptoms to watch out for and need for close follow up. Patient and/or guardian understands verbal and written discharge instructions. Patient and/or guardian comfortable with plan and disposition.  Patient and/or guardian has a clear mental status at this time, good insight into illness (after discussion and teaching) and has clear judgment to make decisions regarding their care  Documentation was completed with the aid of voice recognition software. Transcription may contain typographical errors.   Final Clinical Impressions(s) / UC Diagnoses   Final diagnoses:  Chronic pain of left wrist  Tendinitis of left wrist     Discharge Instructions      You were seen today for wrist pain. Your symptoms are most consistent with tendonitis. Tendonitis is inflammation of the tendons, which are the tough cords that connect muscles to bones. When tendons become irritated, they can cause pain, stiffness, and discomfort with movement or activity. You have been placed in a wrist splint to provide support and reduce strain. Take the prednisone as prescribed. You may use Tylenol if you need extra pain relief.  Apply ice to the wrist for 20 minutes at a time several times a day, using a towel or cloth between the ice and your skin to avoid irritation. Avoid heavy lifting, pushing, or pulling with the wrist until your symptoms improve. Most cases of tendonitis improve within a few weeks with rest, medication, and supportive care. Please follow up with an orthopedic specialist if your pain does not improve, interferes with daily activities, or worsens. Go to the emergency department right away if you develop sudden severe pain, inability to move your wrist or fingers, new numbness, tingling, weakness, or changes in color or temperature of your hand.       ED Prescriptions     Medication Sig Dispense Auth. Provider   predniSONE (DELTASONE) 20 MG tablet Take 2 tablets (40 mg total) by mouth daily for 5 days. 10 tablet Iola Lukes, FNP      PDMP not reviewed this encounter.   Iola Lukes, FNP 11/17/24 1200

## 2024-11-17 NOTE — ED Triage Notes (Signed)
 Pt c/o left wrist pain x 1 year-denies injury-states he has not sought medical care until today-no pain meds for c/o over the last year-NAD-steady gait
